# Patient Record
Sex: Male | Born: 1970 | Race: White | Hispanic: No | Marital: Married | Smoking: Never smoker
Health system: Southern US, Community
[De-identification: ages and names within clinical notes are randomized; demographics above are authoritative.]

## PROBLEM LIST (undated history)

## (undated) DIAGNOSIS — M1990 Unspecified osteoarthritis, unspecified site: Secondary | ICD-10-CM

## (undated) DIAGNOSIS — M797 Fibromyalgia: Secondary | ICD-10-CM

## (undated) HISTORY — DX: Fibromyalgia: M79.7

## (undated) HISTORY — DX: Unspecified osteoarthritis, unspecified site: M19.90

## (undated) HISTORY — PX: BACK SURGERY: SHX140

## (undated) HISTORY — PX: SHOULDER SURGERY: SHX246

## (undated) HISTORY — PX: KNEE SURGERY: SHX244

## (undated) HISTORY — PX: FOOT SURGERY: SHX648

## (undated) HISTORY — PX: VASECTOMY: SHX75

## (undated) HISTORY — PX: COLONOSCOPY: SHX174

## (undated) HISTORY — PX: ELBOW SURGERY: SHX618

## (undated) HISTORY — PX: HAND SURGERY: SHX662

---

## 2007-05-29 ENCOUNTER — Emergency Department: Payer: Self-pay | Admitting: Emergency Medicine

## 2007-08-11 ENCOUNTER — Ambulatory Visit: Payer: Self-pay | Admitting: Internal Medicine

## 2007-08-30 ENCOUNTER — Emergency Department: Payer: Self-pay | Admitting: Emergency Medicine

## 2013-12-16 ENCOUNTER — Ambulatory Visit (INDEPENDENT_AMBULATORY_CARE_PROVIDER_SITE_OTHER): Payer: Federal, State, Local not specified - PPO | Admitting: Podiatry

## 2013-12-16 ENCOUNTER — Encounter: Payer: Self-pay | Admitting: Podiatry

## 2013-12-16 ENCOUNTER — Ambulatory Visit: Payer: Self-pay | Admitting: Podiatry

## 2013-12-16 ENCOUNTER — Ambulatory Visit (INDEPENDENT_AMBULATORY_CARE_PROVIDER_SITE_OTHER): Payer: Federal, State, Local not specified - PPO

## 2013-12-16 VITALS — BP 125/82 | HR 79 | Resp 16 | Ht 70.0 in | Wt 340.0 lb

## 2013-12-16 DIAGNOSIS — M722 Plantar fascial fibromatosis: Secondary | ICD-10-CM

## 2013-12-16 DIAGNOSIS — M79671 Pain in right foot: Secondary | ICD-10-CM

## 2013-12-16 DIAGNOSIS — D2120 Benign neoplasm of connective and other soft tissue of unspecified lower limb, including hip: Secondary | ICD-10-CM

## 2013-12-16 DIAGNOSIS — M79609 Pain in unspecified limb: Secondary | ICD-10-CM

## 2013-12-16 MED ORDER — MELOXICAM 15 MG PO TABS: 15.0000 mg | ORAL_TABLET | Freq: Every day | ORAL | Status: DC

## 2013-12-16 MED ORDER — METHYLPREDNISOLONE (PAK) 4 MG PO TABS: ORAL_TABLET | ORAL | Status: DC

## 2013-12-16 NOTE — Progress Notes (Signed)
He presents today after a fall stating that his right foot is starting to feel better after the fall but he did give a quite a twist as he was trying to get into his hot tub after and ice event. He states that his heels feel like his walking glass first thing in the morning anytime he is been sitting for a while and gets back up to walk. He states this been going on for quite some time and he still has pain to the dorsum of his right foot where surgery had been previously performed.  Objective: Vital signs are stable alert and oriented x3. Pulses are palpable. Pain on palpation medial continued tubercles bilateral. Radiographic evaluation of the right foot does not demonstrate any type of osseous abnormalities consistent with an injury. He does have soft tissue increase in density at the plantar fascial calcaneal insertion site with a small plantar distally oriented calcaneal heel spur. This is indicative of plantar fasciitis. Cutaneous evaluation demonstrates supple while hydrated cutis a few areas of scratches and scraped from the fall.  Assessment: Plantar fasciitis bilateral foot. Dorsal swelling right foot. Sprain right foot.  Plan: Discussed etiology pathology conservative versus surgical therapies. I injected the bilateral heels today wrote her prescription for Sterapred Dosepak to be followed by Mobic and I will followup with him in one month we discussed appropriate shoe gear stretching exercises and ice therapy. Both oral and written home-going instructions were given.

## 2014-01-13 ENCOUNTER — Encounter: Payer: Self-pay | Admitting: Podiatry

## 2014-01-13 ENCOUNTER — Ambulatory Visit (INDEPENDENT_AMBULATORY_CARE_PROVIDER_SITE_OTHER): Payer: Federal, State, Local not specified - PPO | Admitting: Podiatry

## 2014-01-13 VITALS — BP 144/88 | HR 71 | Resp 16

## 2014-01-13 DIAGNOSIS — M722 Plantar fascial fibromatosis: Secondary | ICD-10-CM

## 2014-01-13 NOTE — Progress Notes (Signed)
Doing all right , maybe 80% better .  Objective: Vital signs are stable he is alert and oriented x3. His pain on palpation medial continued tubercles. Pulses are strongly palpable no pain on medial lateral compression the calcaneus.  Assessment: Plantar fasciitis bilateral.  Plan: Injected bilateral heels today continue anti-inflammatories followup with me in the near future.

## 2014-02-10 ENCOUNTER — Ambulatory Visit (INDEPENDENT_AMBULATORY_CARE_PROVIDER_SITE_OTHER): Payer: Federal, State, Local not specified - PPO | Admitting: Podiatry

## 2014-02-10 VITALS — BP 118/75 | HR 95 | Resp 16 | Ht 69.0 in | Wt 384.0 lb

## 2014-02-10 DIAGNOSIS — M778 Other enthesopathies, not elsewhere classified: Secondary | ICD-10-CM

## 2014-02-10 DIAGNOSIS — M7750 Other enthesopathy of unspecified foot: Secondary | ICD-10-CM

## 2014-02-10 DIAGNOSIS — M779 Enthesopathy, unspecified: Principal | ICD-10-CM

## 2014-02-10 NOTE — Progress Notes (Signed)
He follows up today for bilateral plantar fasciitis that he also complains of a painful arthritic area to the dorsal aspect of the right foot that previously had surgery. He states this been bothering him and swelling for the past few days.  Objective: Vital signs are stable he is alert and oriented x3. Pulses are strongly palpable bilateral and there is no pain on palpation medial continued tubercles of the bilateral foot. He does have pain on palpation with an area of swelling dorsal aspect of the right foot overlying a surgical scar.  Assessment: Bursitis osteoarthritis right foot resolving plantar fasciitis bilateral.  Plan: Injected dorsal aspect of the right foot today with Kenalog and local anesthetic followup with him as needed.

## 2014-03-03 ENCOUNTER — Ambulatory Visit: Payer: Self-pay | Admitting: Physician Assistant

## 2014-03-24 ENCOUNTER — Ambulatory Visit: Payer: Federal, State, Local not specified - PPO | Admitting: Podiatry

## 2014-04-07 ENCOUNTER — Ambulatory Visit (INDEPENDENT_AMBULATORY_CARE_PROVIDER_SITE_OTHER): Payer: Federal, State, Local not specified - PPO | Admitting: Podiatry

## 2014-04-07 VITALS — BP 120/82 | HR 74 | Resp 16

## 2014-04-07 DIAGNOSIS — L03119 Cellulitis of unspecified part of limb: Secondary | ICD-10-CM

## 2014-04-07 DIAGNOSIS — L03115 Cellulitis of right lower limb: Secondary | ICD-10-CM

## 2014-04-07 DIAGNOSIS — L02419 Cutaneous abscess of limb, unspecified: Secondary | ICD-10-CM

## 2014-04-07 MED ORDER — CEPHALEXIN 500 MG PO CAPS: 500.0000 mg | ORAL_CAPSULE | Freq: Four times a day (QID) | ORAL | Status: DC

## 2014-04-07 NOTE — Progress Notes (Signed)
He presents today complaining of a red hot swollen cellulitic right leg is also complaining of right shoulder pain due to a recent overexertion. States that he has been running slight fevers over the weekend. He denies nausea and vomiting and calf pain.  Objective: Vital signs are stable he is alert and oriented x3 pulses are intact. Mildly edematous right calf with cellulitis excoriations to the posterior aspect of the right calf are also noted. No calf pain.  Assessment: Cellulitis right calf and leg. History of plantar fasciitis. History of osteoarthritis dorsal aspect right foot.  Plan: Keflex 500 mg 1 by mouth 4 times a day x10 days. We'll followup with him in 2 weeks

## 2014-04-18 ENCOUNTER — Other Ambulatory Visit: Payer: Self-pay | Admitting: *Deleted

## 2014-04-18 MED ORDER — MELOXICAM 15 MG PO TABS: 15.0000 mg | ORAL_TABLET | Freq: Every day | ORAL | Status: DC

## 2014-04-18 NOTE — Telephone Encounter (Signed)
[facility] sent fax request for mobic 15 mg #30 with 3 refills. Per dr Milinda Pointer fill rx.

## 2014-04-23 ENCOUNTER — Other Ambulatory Visit: Payer: Self-pay | Admitting: *Deleted

## 2014-04-23 NOTE — Telephone Encounter (Signed)
Refill request for Mobic 15mg , quantity of 30.  Dr. Marta Antu the refill.

## 2014-04-24 ENCOUNTER — Ambulatory Visit: Payer: Federal, State, Local not specified - PPO | Admitting: Podiatrist

## 2014-05-09 ENCOUNTER — Ambulatory Visit: Payer: Self-pay | Admitting: Orthopedic Surgery

## 2014-05-14 ENCOUNTER — Other Ambulatory Visit: Payer: Self-pay | Admitting: Orthopedic Surgery

## 2014-05-14 DIAGNOSIS — M25511 Pain in right shoulder: Secondary | ICD-10-CM

## 2014-05-26 ENCOUNTER — Other Ambulatory Visit: Payer: Self-pay

## 2014-08-13 ENCOUNTER — Ambulatory Visit (INDEPENDENT_AMBULATORY_CARE_PROVIDER_SITE_OTHER): Payer: Federal, State, Local not specified - PPO | Admitting: Podiatry

## 2014-08-13 ENCOUNTER — Encounter: Payer: Self-pay | Admitting: Podiatry

## 2014-08-13 VITALS — BP 139/91 | HR 83 | Resp 16

## 2014-08-13 DIAGNOSIS — I872 Venous insufficiency (chronic) (peripheral): Secondary | ICD-10-CM

## 2014-08-13 DIAGNOSIS — R609 Edema, unspecified: Secondary | ICD-10-CM

## 2014-08-13 NOTE — Progress Notes (Signed)
He presents today stating that his feet are doing pretty good he is having issues with his legs. He states that his legs swell a regular basis and routinely go down with elevation and once he wakes up in the morning however he states recently he has noticed a change in his legs never seemed to go down even with elevation and after bed rest. He denies fever chills nausea vomiting muscle aches pains shortness of breath. There he does relate sleeping in a recliner.   objective: Vital signs are was alert oriented x3. Diastolic slightly elevated today from previous visits. Pulses are palpable. Hard indurated swollen and edematous legs bilateral. No calf pain. No warmth on palpation.  Assessment: Edema possible venous insufficiency bilateral. Suggested that he followup with his primary Dr. for fluid pill and evaluation of his heart such as an EKG and echocardiogram.  Plan: He will followup with her in vascular for a Doppler duplex and visit. He will followup with his primary Dr.

## 2014-09-18 ENCOUNTER — Ambulatory Visit (INDEPENDENT_AMBULATORY_CARE_PROVIDER_SITE_OTHER): Payer: Federal, State, Local not specified - PPO | Admitting: Podiatry

## 2014-09-18 ENCOUNTER — Encounter: Payer: Self-pay | Admitting: Podiatry

## 2014-09-18 ENCOUNTER — Ambulatory Visit (INDEPENDENT_AMBULATORY_CARE_PROVIDER_SITE_OTHER): Payer: Federal, State, Local not specified - PPO

## 2014-09-18 VITALS — BP 124/77 | HR 109 | Resp 16

## 2014-09-18 DIAGNOSIS — M722 Plantar fascial fibromatosis: Secondary | ICD-10-CM

## 2014-09-18 MED ORDER — TRIAMCINOLONE ACETONIDE 10 MG/ML IJ SUSP
10.0000 mg | Freq: Once | INTRAMUSCULAR | Status: AC
  Administered 2014-09-18: 10 mg

## 2014-09-18 NOTE — Patient Instructions (Signed)
Plantar Fasciitis (Heel Spur Syndrome) with Rehab The plantar fascia is a fibrous, ligament-like, soft-tissue structure that spans the bottom of the foot. Plantar fasciitis is a condition that causes pain in the foot due to inflammation of the tissue. SYMPTOMS   Pain and tenderness on the underneath side of the foot.  Pain that worsens with standing or walking. CAUSES  Plantar fasciitis is caused by irritation and injury to the plantar fascia on the underneath side of the foot. Common mechanisms of injury include:  Direct trauma to bottom of the foot.  Damage to a small nerve that runs under the foot where the main fascia attaches to the heel bone.  Stress placed on the plantar fascia due to bone spurs. RISK INCREASES WITH:   Activities that place stress on the plantar fascia (running, jumping, pivoting, or cutting).  Poor strength and flexibility.  Improperly fitted shoes.  Tight calf muscles.  Flat feet.  Failure to warm-up properly before activity.  Obesity. PREVENTION  Warm up and stretch properly before activity.  Allow for adequate recovery between workouts.  Maintain physical fitness:  Strength, flexibility, and endurance.  Cardiovascular fitness.  Maintain a health body weight.  Avoid stress on the plantar fascia.  Wear properly fitted shoes, including arch supports for individuals who have flat feet. PROGNOSIS  If treated properly, then the symptoms of plantar fasciitis usually resolve without surgery. However, occasionally surgery is necessary. RELATED COMPLICATIONS   Recurrent symptoms that may result in a chronic condition.  Problems of the lower back that are caused by compensating for the injury, such as limping.  Pain or weakness of the foot during push-off following surgery.  Chronic inflammation, scarring, and partial or complete fascia tear, occurring more often from repeated injections. TREATMENT  Treatment initially involves the use of  ice and medication to help reduce pain and inflammation. The use of strengthening and stretching exercises may help reduce pain with activity, especially stretches of the Achilles tendon. These exercises may be performed at home or with a therapist. Your caregiver may recommend that you use heel cups of arch supports to help reduce stress on the plantar fascia. Occasionally, corticosteroid injections are given to reduce inflammation. If symptoms persist for greater than 6 months despite non-surgical (conservative), then surgery may be recommended.  MEDICATION   If pain medication is necessary, then nonsteroidal anti-inflammatory medications, such as aspirin and ibuprofen, or other minor pain relievers, such as acetaminophen, are often recommended.  Do not take pain medication within 7 days before surgery.  Prescription pain relievers may be given if deemed necessary by your caregiver. Use only as directed and only as much as you need.  Corticosteroid injections may be given by your caregiver. These injections should be reserved for the most serious cases, because they may only be given a certain number of times. HEAT AND COLD  Cold treatment (icing) relieves pain and reduces inflammation. Cold treatment should be applied for 10 to 15 minutes every 2 to 3 hours for inflammation and pain and immediately after any activity that aggravates your symptoms. Use ice packs or massage the area with a piece of ice (ice massage).  Heat treatment may be used prior to performing the stretching and strengthening activities prescribed by your caregiver, physical therapist, or athletic trainer. Use a heat pack or soak the injury in warm water. SEEK IMMEDIATE MEDICAL CARE IF:  Treatment seems to offer no benefit, or the condition worsens.  Any medications produce adverse side effects. EXERCISES RANGE   OF MOTION (ROM) AND STRETCHING EXERCISES - Plantar Fasciitis (Heel Spur Syndrome) These exercises may help you  when beginning to rehabilitate your injury. Your symptoms may resolve with or without further involvement from your physician, physical therapist or athletic trainer. While completing these exercises, remember:   Restoring tissue flexibility helps normal motion to return to the joints. This allows healthier, less painful movement and activity.  An effective stretch should be held for at least 30 seconds.  A stretch should never be painful. You should only feel a gentle lengthening or release in the stretched tissue. RANGE OF MOTION - Toe Extension, Flexion  Sit with your right / left leg crossed over your opposite knee.  Grasp your toes and gently pull them back toward the top of your foot. You should feel a stretch on the bottom of your toes and/or foot.  Hold this stretch for __________ seconds.  Now, gently pull your toes toward the bottom of your foot. You should feel a stretch on the top of your toes and or foot.  Hold this stretch for __________ seconds. Repeat __________ times. Complete this stretch __________ times per day.  RANGE OF MOTION - Ankle Dorsiflexion, Active Assisted  Remove shoes and sit on a chair that is preferably not on a carpeted surface.  Place right / left foot under knee. Extend your opposite leg for support.  Keeping your heel down, slide your right / left foot back toward the chair until you feel a stretch at your ankle or calf. If you do not feel a stretch, slide your bottom forward to the edge of the chair, while still keeping your heel down.  Hold this stretch for __________ seconds. Repeat __________ times. Complete this stretch __________ times per day.  STRETCH - Gastroc, Standing  Place hands on wall.  Extend right / left leg, keeping the front knee somewhat bent.  Slightly point your toes inward on your back foot.  Keeping your right / left heel on the floor and your knee straight, shift your weight toward the wall, not allowing your back to  arch.  You should feel a gentle stretch in the right / left calf. Hold this position for __________ seconds. Repeat __________ times. Complete this stretch __________ times per day. STRETCH - Soleus, Standing  Place hands on wall.  Extend right / left leg, keeping the other knee somewhat bent.  Slightly point your toes inward on your back foot.  Keep your right / left heel on the floor, bend your back knee, and slightly shift your weight over the back leg so that you feel a gentle stretch deep in your back calf.  Hold this position for __________ seconds. Repeat __________ times. Complete this stretch __________ times per day. STRETCH - Gastrocsoleus, Standing  Note: This exercise can place a lot of stress on your foot and ankle. Please complete this exercise only if specifically instructed by your caregiver.   Place the ball of your right / left foot on a step, keeping your other foot firmly on the same step.  Hold on to the wall or a rail for balance.  Slowly lift your other foot, allowing your body weight to press your heel down over the edge of the step.  You should feel a stretch in your right / left calf.  Hold this position for __________ seconds.  Repeat this exercise with a slight bend in your right / left knee. Repeat __________ times. Complete this stretch __________ times per day.    STRENGTHENING EXERCISES - Plantar Fasciitis (Heel Spur Syndrome)  These exercises may help you when beginning to rehabilitate your injury. They may resolve your symptoms with or without further involvement from your physician, physical therapist or athletic trainer. While completing these exercises, remember:   Muscles can gain both the endurance and the strength needed for everyday activities through controlled exercises.  Complete these exercises as instructed by your physician, physical therapist or athletic trainer. Progress the resistance and repetitions only as guided. STRENGTH -  Towel Curls  Sit in a chair positioned on a non-carpeted surface.  Place your foot on a towel, keeping your heel on the floor.  Pull the towel toward your heel by only curling your toes. Keep your heel on the floor.  If instructed by your physician, physical therapist or athletic trainer, add ____________________ at the end of the towel. Repeat __________ times. Complete this exercise __________ times per day. STRENGTH - Ankle Inversion  Secure one end of a rubber exercise band/tubing to a fixed object (table, pole). Loop the other end around your foot just before your toes.  Place your fists between your knees. This will focus your strengthening at your ankle.  Slowly, pull your big toe up and in, making sure the band/tubing is positioned to resist the entire motion.  Hold this position for __________ seconds.  Have your muscles resist the band/tubing as it slowly pulls your foot back to the starting position. Repeat __________ times. Complete this exercises __________ times per day.  Document Released: 11/14/2005 Document Revised: 02/06/2012 Document Reviewed: 02/26/2009 [employer] Patient Information 2015 [employer]. This information is not intended to replace advice given to you by your health care provider. Make sure you discuss any questions you have with your health care provider.  

## 2014-09-18 NOTE — Progress Notes (Signed)
Patient ID: Jacob Alvarez, male   DOB: [birth year], 43 y.o.   MRN: [id number]  Subjective: 43 year old male returns the office today for complaints of bilateral heel pain. He states he previously had an injection earlier this year by Dr. Milinda Pointer to both heels. He states that he started to have some reoccurrence of symptoms into the same area as before. He states that he has pain mostly in the morning or after periods of rest. He states that the pain is better with ambulation. He can be going to [employer] next week and is requesting injections to the bilateral feet before he goes. He's been continuing stretching exercises as well as icing. He denies any recent erythema or cellulitis to his bilateral lower extremities. He is also followup with been studies for which he has seen a vein specialist next week. He states the appearance of his legs are the same as they have been over the last couple of years. He denies any recent fevers, chills, nausea, vomiting. Denies any recent injury or trauma to the area. No other complaints at this time.  Objective: AAO x3, NAD DP/PT pulses palpable bilaterally, CRT less than 3 seconds Protective sensation intact with Simms Weinstein monofilament, vibratory sensation intact, Achilles tendon reflex intact Bilateral lower extremity edema and varicose veins. There is no erythema, cellulitis at this time. Tenderness to palpation of the plantar medial tubercle of the calcaneus at the insertion the plantar fascia. There is no pain with lateral compression or along the posterior aspect of the calcaneus. No pain along the course of plantar fascial in the arch of the foot. Plantar fascia appears intact. Decrease in medial arch height upon weightbearing. There is no tenderness to palpation over other areas of the foot or pain with vibratory sensation. MMT 5/5, ROM WNL No calf pain with compression, erythema, increased warmth.  Assessment: 43 year old male with bilateral plantar  fasciitis  Plan: -X-rays were obtained and the left lower extremity as they were previously taken on the right. -Conservative versus surgical treatment discussed including alternatives, risks, complications. -Patient elects to proceed with steroid injection into the bilateral heels. He understands the risks and complications associated with injection. Under sterile skin preparation, a total of 2.5cc of kenalog 10, 0.5% Marcaine plain, and 2% lidocaine plain were infiltrated into the symptomatic area without complication. A band-aid was applied. Patient tolerated the injection well without complication.  -Continue ice to the affected area. -Continue stretching exercises. -Stressed the importance of supportive shoe gear -Call up with a vein specialist. -Followup in 4 weeks or sooner if any problems are to arise or any changes symptoms. In the meantime call the office with any questions, concerns.

## 2014-10-07 ENCOUNTER — Other Ambulatory Visit: Payer: Self-pay | Admitting: *Deleted

## 2014-10-07 MED ORDER — MELOXICAM 15 MG PO TABS: 15.0000 mg | ORAL_TABLET | Freq: Every day | ORAL | Status: AC

## 2014-10-07 NOTE — Telephone Encounter (Signed)
[facility] sent refill req for mobic 15 mg #30 with 3 refills one by mouth daily. Per dr Milinda Pointer refill #30 with 3 refills.

## 2014-10-15 ENCOUNTER — Ambulatory Visit (INDEPENDENT_AMBULATORY_CARE_PROVIDER_SITE_OTHER): Payer: Federal, State, Local not specified - PPO | Admitting: Podiatry

## 2014-10-15 VITALS — BP 125/79 | HR 78 | Resp 16

## 2014-10-15 DIAGNOSIS — M722 Plantar fascial fibromatosis: Secondary | ICD-10-CM

## 2014-10-15 DIAGNOSIS — L03115 Cellulitis of right lower limb: Secondary | ICD-10-CM

## 2014-10-15 NOTE — Progress Notes (Signed)
Presents today for follow-up of plantar fasciitis bilateral. He states that he is doing pretty well.  Objective: Vital signs are stable he is alert and oriented 3 no pain on palpation medial calcaneal tubercles bilateral.  Assessment: Well-healing plantar fasciitis bilateral.  Plan: Follow up with me as needed.

## 2015-05-28 ENCOUNTER — Other Ambulatory Visit: Payer: Self-pay | Admitting: Bariatrics

## 2015-05-28 DIAGNOSIS — E669 Obesity, unspecified: Secondary | ICD-10-CM

## 2015-06-08 ENCOUNTER — Other Ambulatory Visit: Payer: Self-pay

## 2015-06-08 ENCOUNTER — Ambulatory Visit: Payer: Federal, State, Local not specified - PPO

## 2015-06-08 ENCOUNTER — Ambulatory Visit: Payer: Self-pay

## 2015-06-18 ENCOUNTER — Ambulatory Visit: Payer: Federal, State, Local not specified - PPO | Attending: Otolaryngology

## 2015-06-18 DIAGNOSIS — G4733 Obstructive sleep apnea (adult) (pediatric): Secondary | ICD-10-CM | POA: Insufficient documentation

## 2015-06-18 DIAGNOSIS — R0683 Snoring: Secondary | ICD-10-CM | POA: Diagnosis not present

## 2015-06-29 ENCOUNTER — Ambulatory Visit
Admission: RE | Admit: 2015-06-29 | Discharge: 2015-06-29 | Disposition: A | Discharge status: Home / Self Care | Payer: Federal, State, Local not specified - PPO | Source: Ambulatory Visit | Attending: Bariatrics | Admitting: Bariatrics

## 2015-06-29 ENCOUNTER — Ambulatory Visit: Payer: Federal, State, Local not specified - PPO

## 2015-06-29 DIAGNOSIS — E669 Obesity, unspecified: Secondary | ICD-10-CM

## 2015-08-05 ENCOUNTER — Ambulatory Visit: Payer: Federal, State, Local not specified - PPO | Attending: Otolaryngology

## 2015-08-05 DIAGNOSIS — G2581 Restless legs syndrome: Secondary | ICD-10-CM | POA: Insufficient documentation

## 2015-08-05 DIAGNOSIS — G4733 Obstructive sleep apnea (adult) (pediatric): Secondary | ICD-10-CM | POA: Diagnosis present

## 2015-08-05 DIAGNOSIS — R0683 Snoring: Secondary | ICD-10-CM | POA: Insufficient documentation

## 2015-08-06 ENCOUNTER — Ambulatory Visit: Payer: Federal, State, Local not specified - PPO

## 2015-08-12 ENCOUNTER — Encounter: Payer: Federal, State, Local not specified - PPO | Admitting: Podiatry

## 2015-08-24 ENCOUNTER — Ambulatory Visit (INDEPENDENT_AMBULATORY_CARE_PROVIDER_SITE_OTHER): Payer: Federal, State, Local not specified - PPO | Admitting: Podiatry

## 2015-08-24 ENCOUNTER — Encounter: Payer: Self-pay | Admitting: Podiatry

## 2015-08-24 VITALS — BP 120/79 | HR 74 | Resp 12

## 2015-08-24 DIAGNOSIS — M722 Plantar fascial fibromatosis: Secondary | ICD-10-CM | POA: Diagnosis not present

## 2015-08-24 NOTE — Progress Notes (Signed)
He presents today for follow-up of bilateral plantar fasciitis. He states that he was doing fine for quite some time and has recently been in the hospital for prostatitis. 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  Objective: Vital signs are stable alert and oriented 3. Pulses are strongly palpable. Neurologic sensorium is intact. Deep tendon reflexes are intact. Muscle strength normal bilateral. Cutaneous evaluation Mr. is no skin breakdown reactive hyperkeratosis medial aspect of the IP joints hallux bilateral. Pain on palpation medial calcaneal tubercles bilateral.  Assessment: Chronic intractable plantar fasciitis bilateral foot.  Plan: Discussed etiology pathology conservative versus surgical therapies. Consider a new set of Orthotics next visit. Discussed appropriate shoe gear stretching exercises ice therapy. Injected the bilateral heels today with Kenalog and local and aesthetic.

## 2015-08-28 NOTE — Progress Notes (Signed)
This encounter was created in error - please disregard.

## 2015-08-29 IMAGING — US US ABDOMEN LIMITED
1 series · 14 of 25 positions shown · non-contrast
Comparison: None.

CLINICAL DATA: 44-year-old male with obesity. Pre operative
clearance. Denies symptoms. Initial encounter.

EXAM:
US ABDOMEN LIMITED - RIGHT UPPER QUADRANT

[Series 1: us abdomen limited · 0.27mm/px · 14 of 41 slices shown]
[im 1/41]
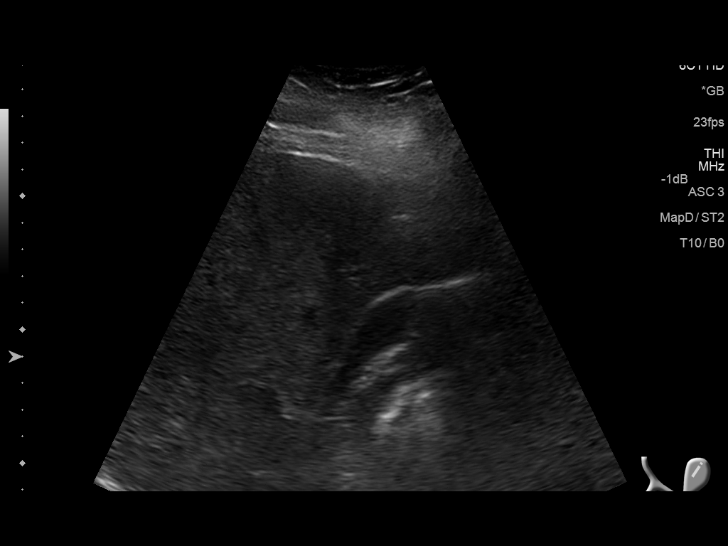
[im 4/41]
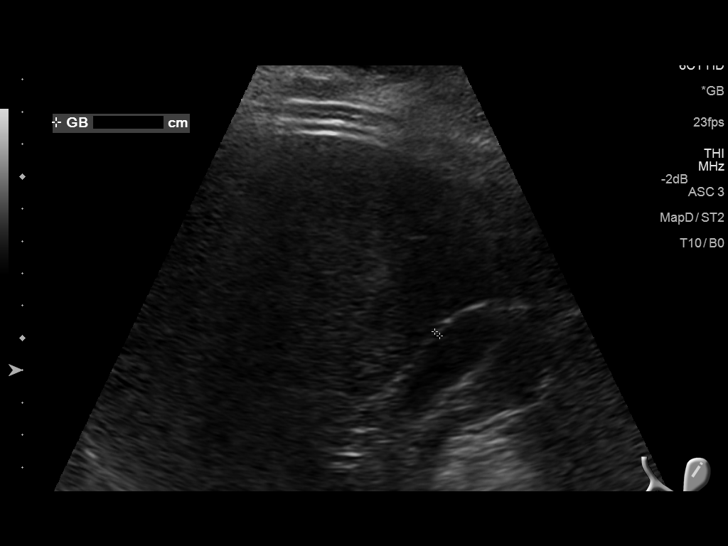
[im 7/41]
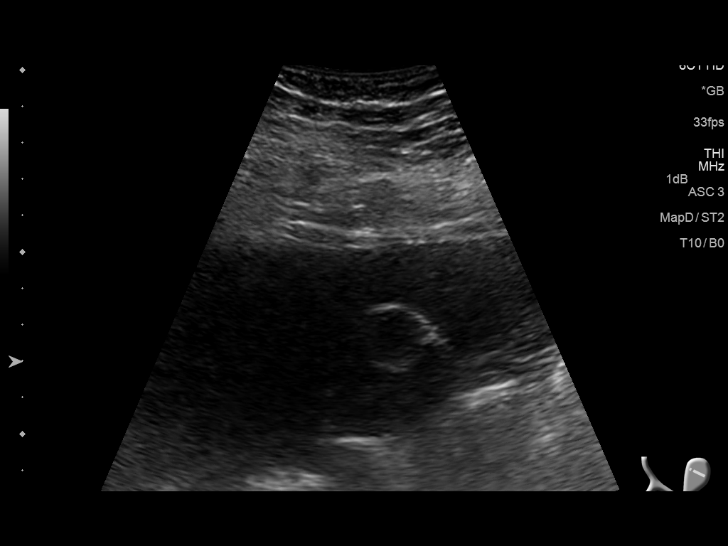
[im 11/41]
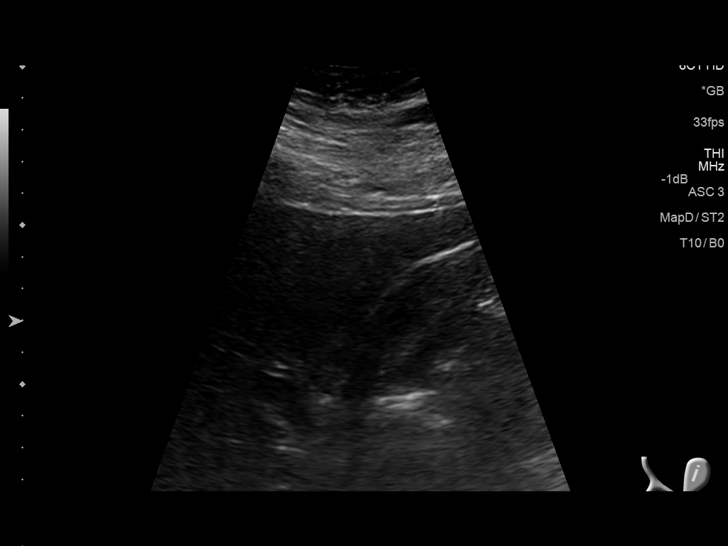
[im 14/41]
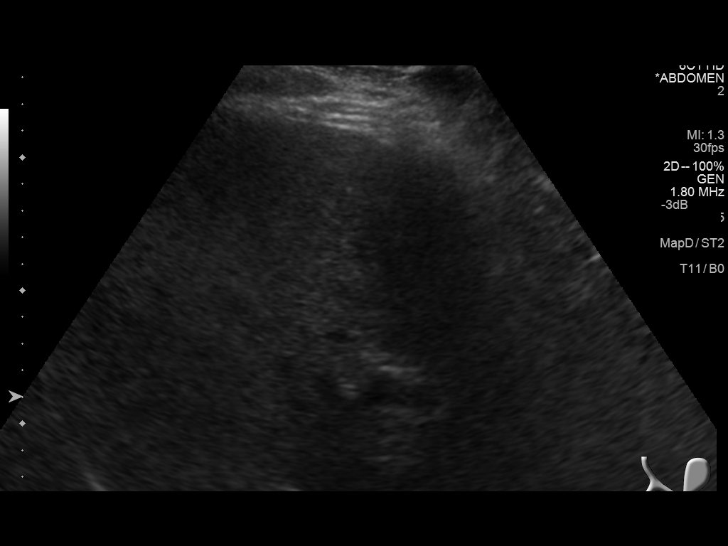
[im 16/41]
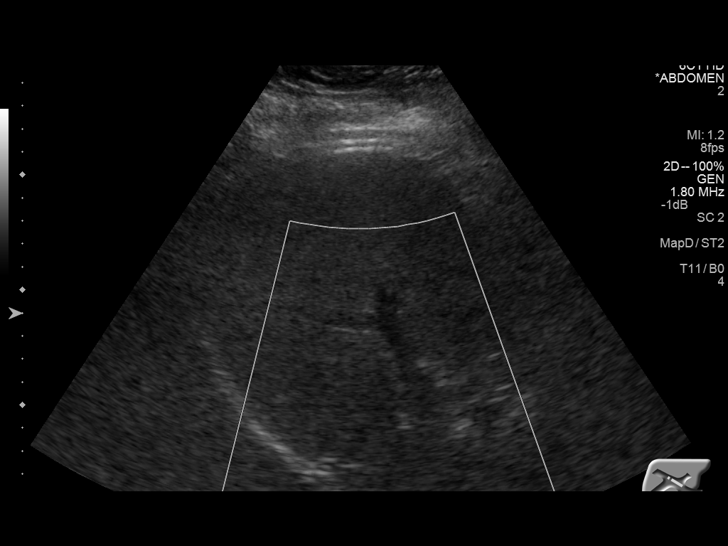
[im 19/41]
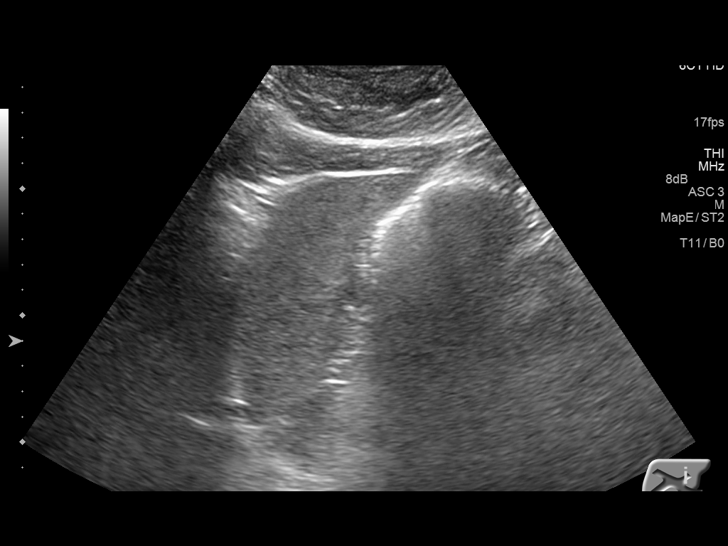
[im 22/41]
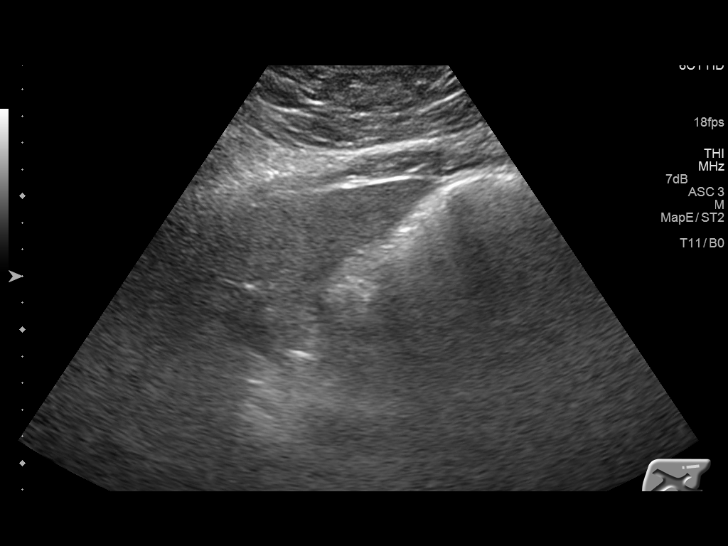
[im 26/41]
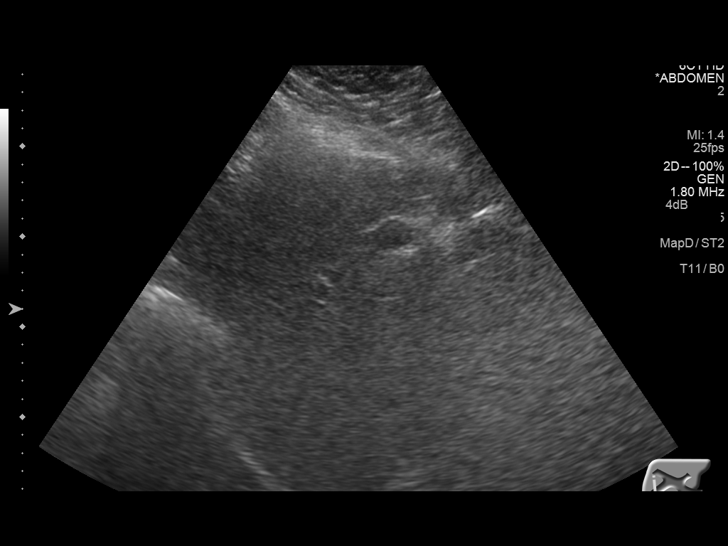
[im 27/41]
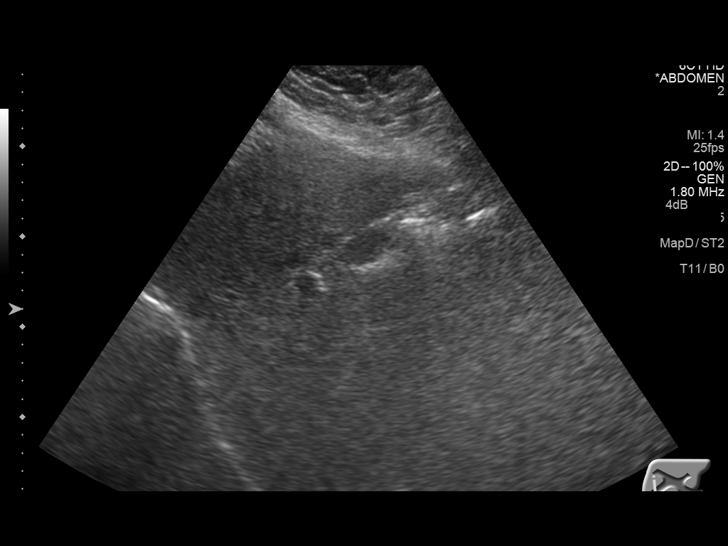
[im 31/41]
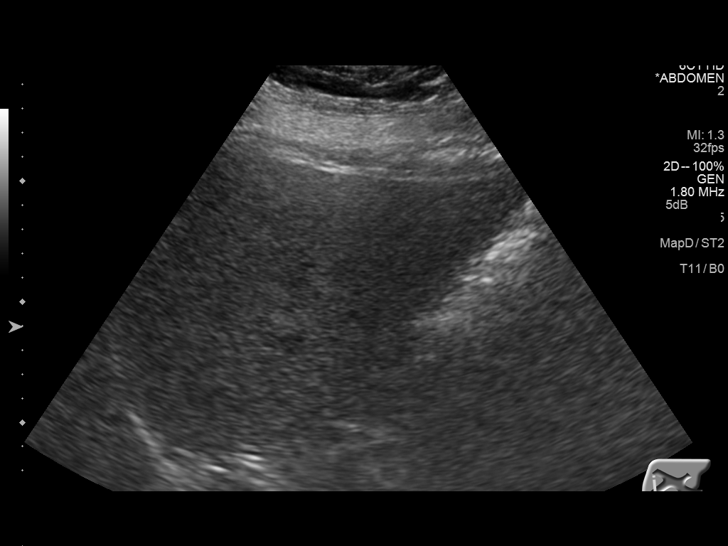
[im 34/41]
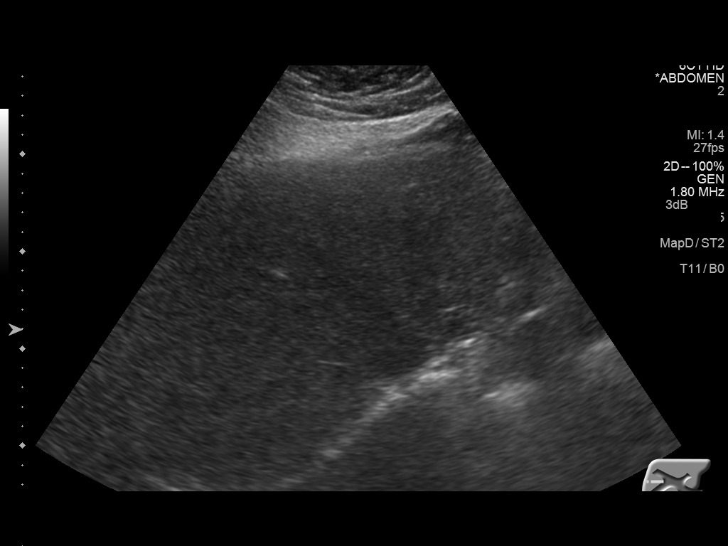
[im 37/41]
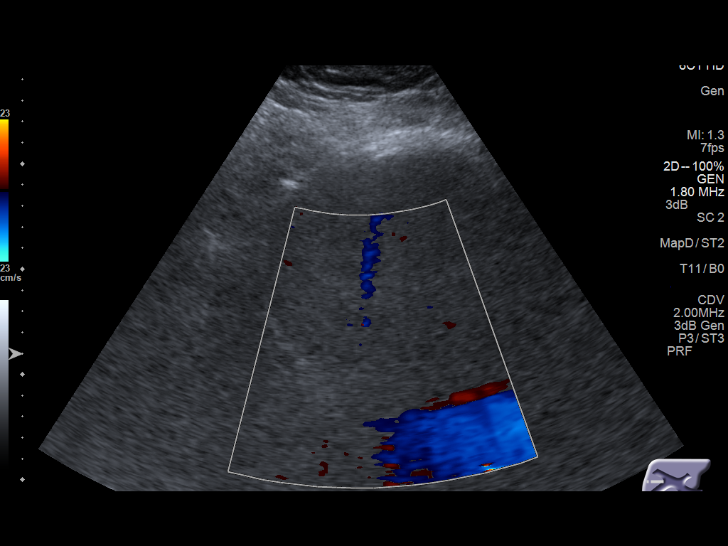
[im 41/41]
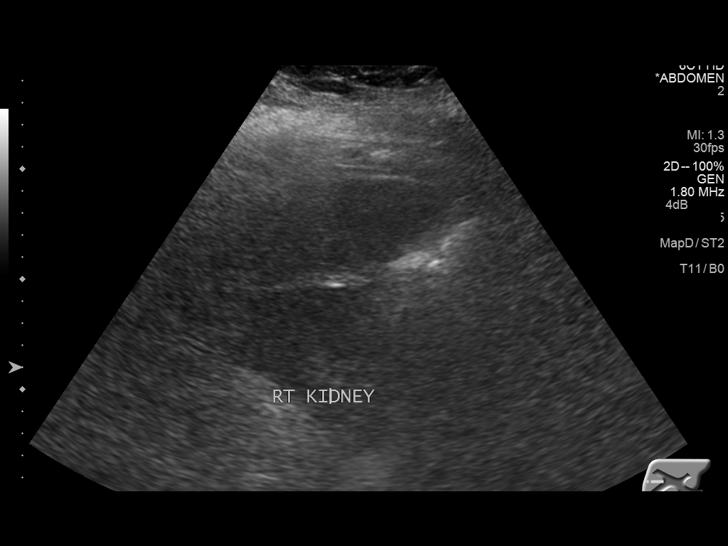

[14 of 25 positions shown; findings below may reference images not displayed]

FINDINGS: Gallbladder:

No gallstones or wall thickening visualized. No sonographic Murphy
sign noted.

Common bile duct:

Diameter: 3.7 mm.

Liver:

Increased echogenicity suggestive of fatty infiltration without
obvious mass or intrahepatic bile duct dilation.
IMPRESSION: Exam limited by patient's habitus.

Fatty infiltration liver suspected.

No gallstones noted.

## 2015-09-28 ENCOUNTER — Ambulatory Visit: Payer: Federal, State, Local not specified - PPO | Admitting: Podiatry

## 2015-11-29 IMAGING — RF DG UGI W/O KUB
1 series · 6 of 6 positions shown · non-contrast
Comparison: None.

CLINICAL DATA: Pre bariatric screening

EXAM:
UPPER GI SERIES WITHOUT KUB
TECHNIQUE: Routine upper GI series was performed with thin/high density/
barium.
FLUOROSCOPY TIME:  Fluoroscopy Time (in minutes and seconds): 0
minutes, 48 seconds
Number of Acquired Images:  7

[Series 1: fluoro_barium singleshot_bw · 0.17mm/px · 6 of 6 slices shown]
[im 1/6]
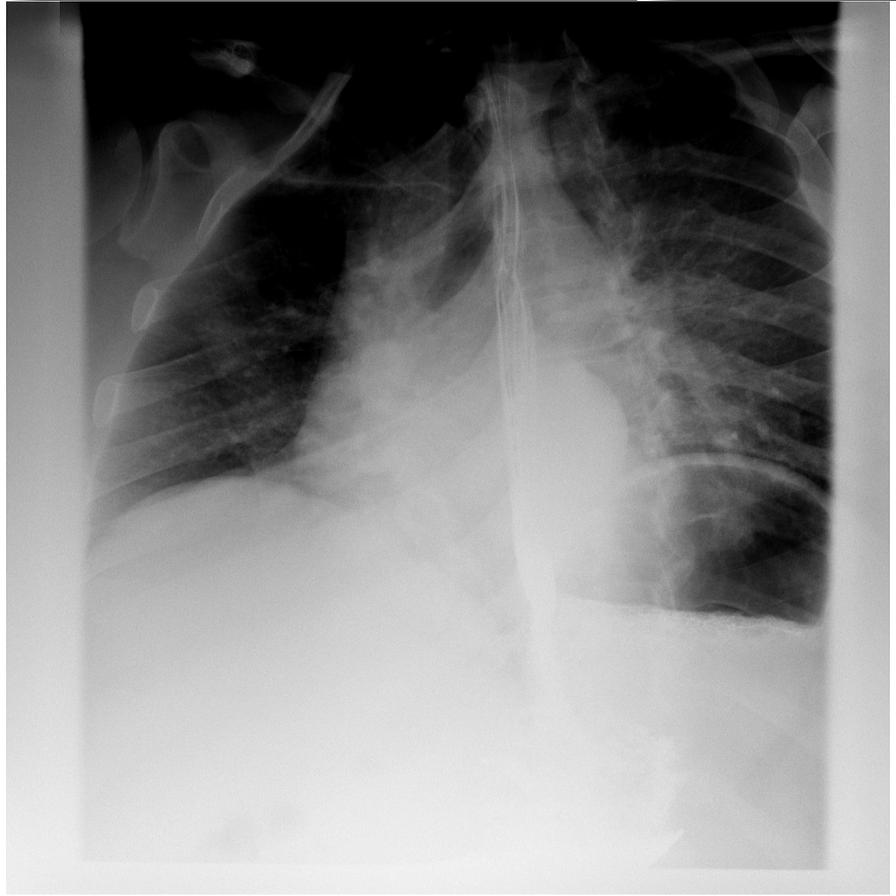
[im 2/6]
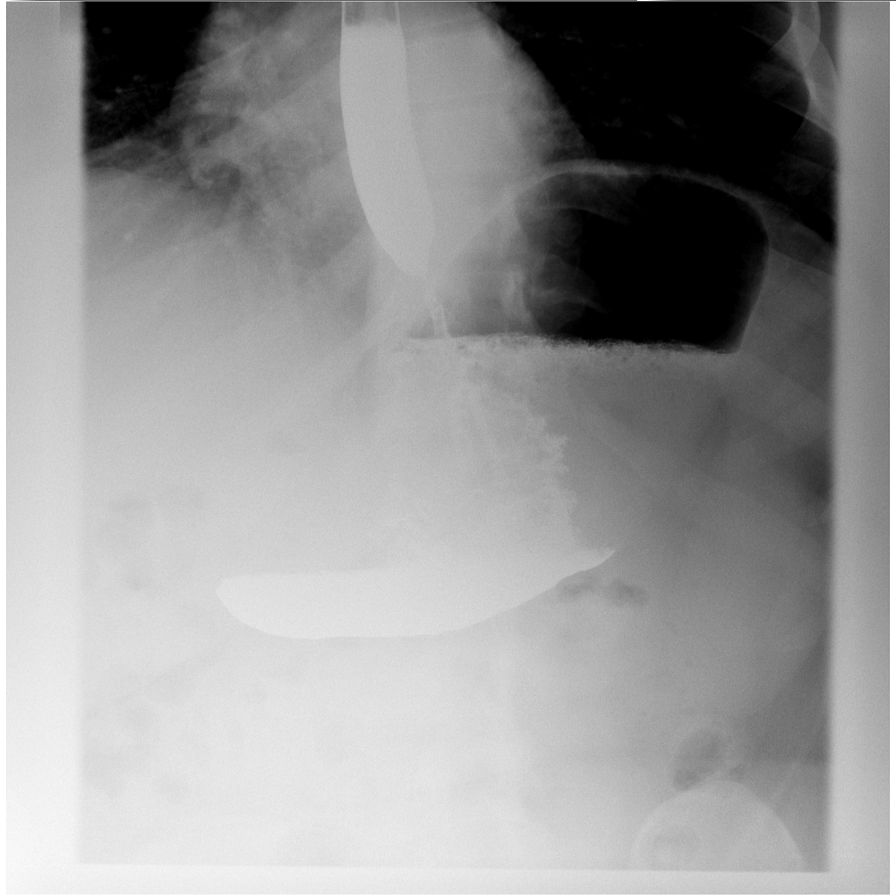
[im 3/6]
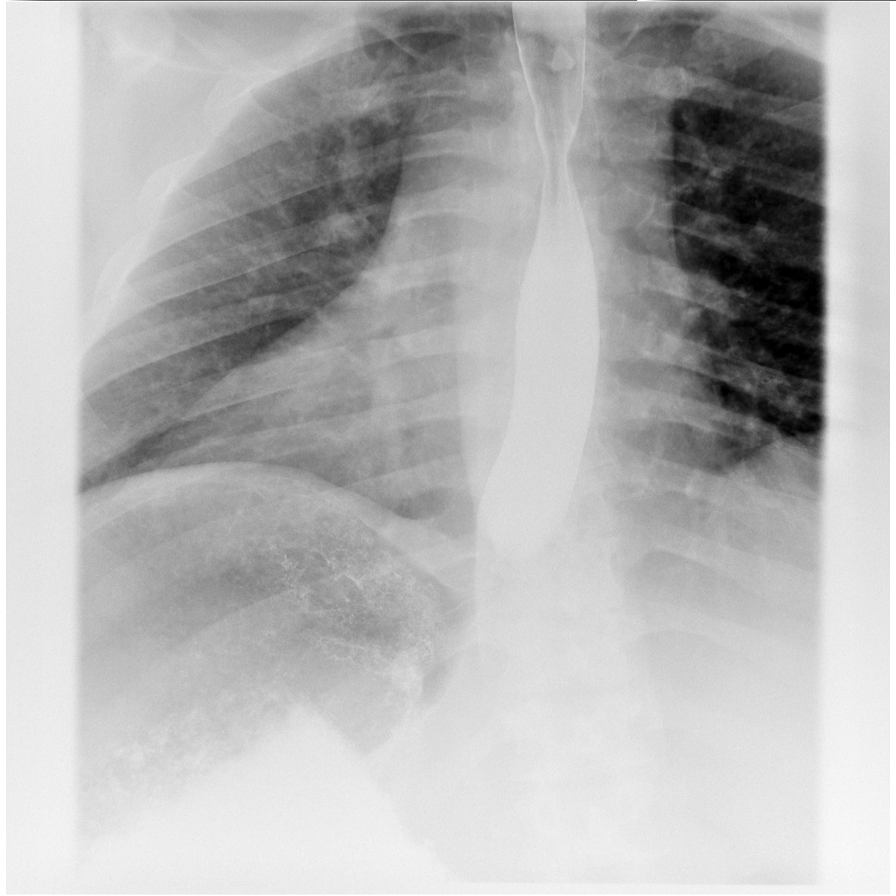
[im 4/6]
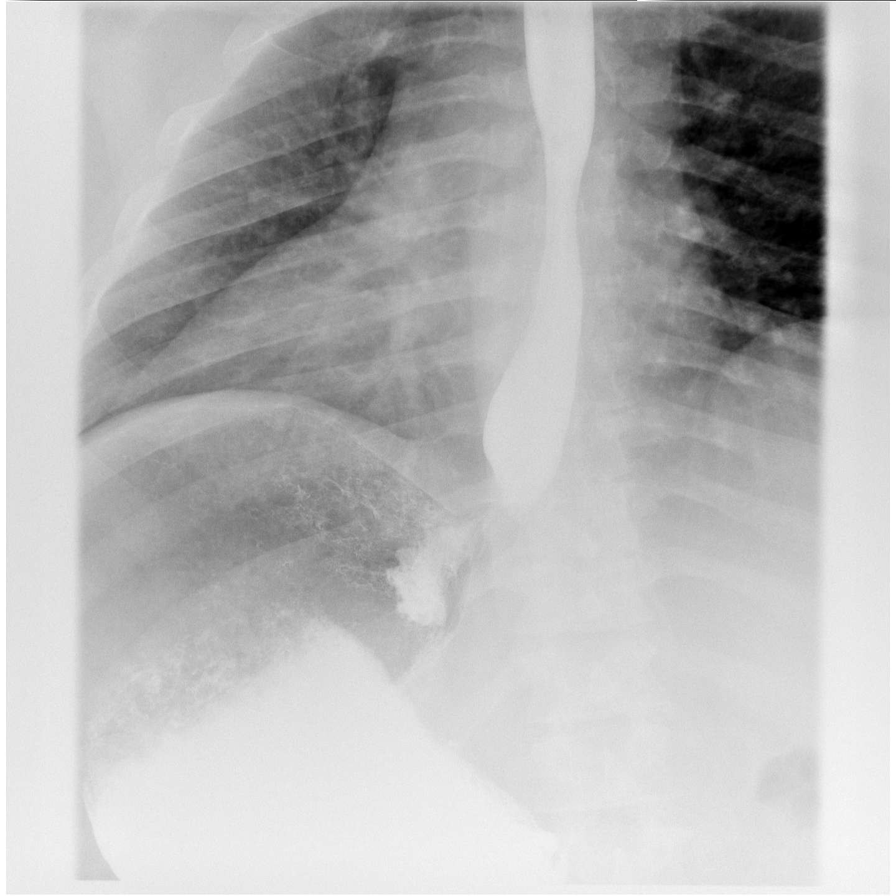
[im 5/6]
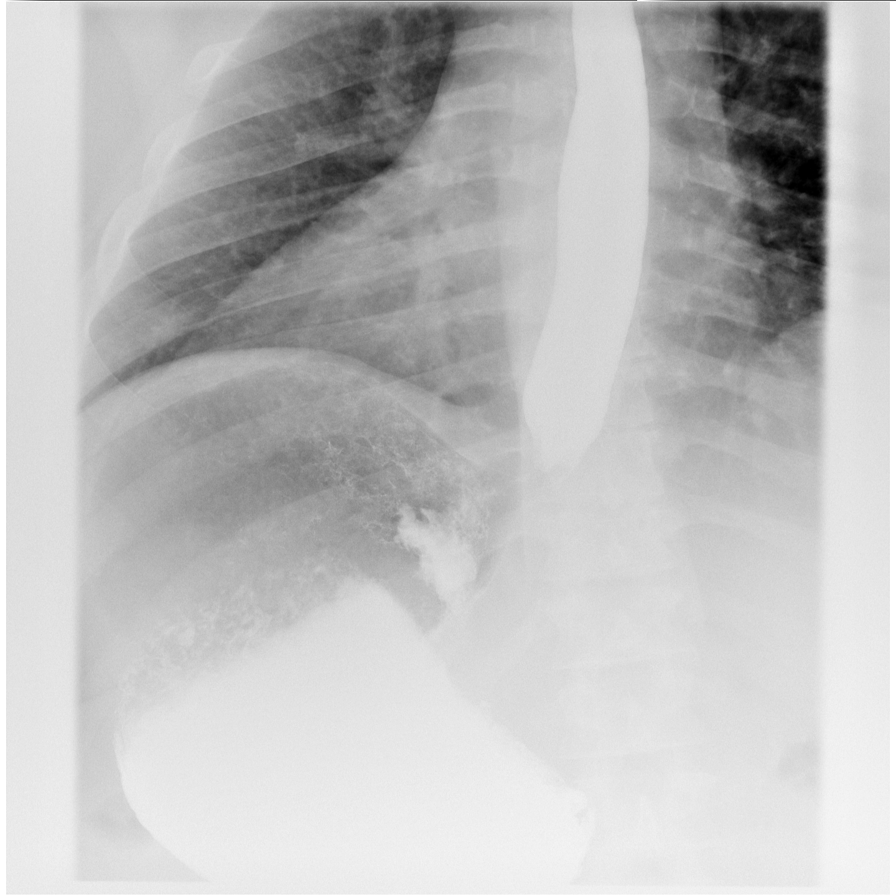
[im 6/6]
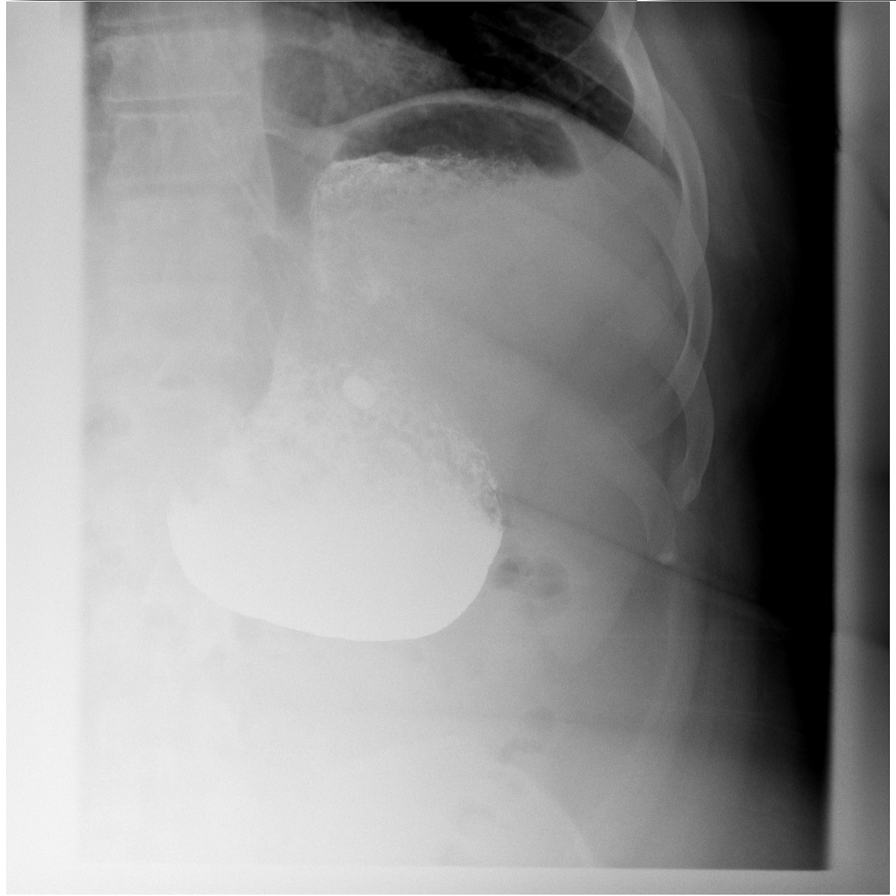

[6 of 6 positions shown; findings below may reference images not displayed]

FINDINGS: The patient ingested the thick and thin barium and the gas-forming
crystals without difficulty the thoracic esophagus distended well.
No reflux was observed. No hiatal hernia was demonstrated. The
barium tablet passed without difficulty.

There was a moderate amount of fluid and food within the stomach due
to the fact that the patient had not fasted overnight due to
abdominal discomfort in the feeling that ingesting more food would
of helped. This was not known until the study was under way. The
stomach where visualized distended well. The ligament of Treitz
however was not observed due to the large amount of fluid and food
in the stomach.
IMPRESSION: Limited study due to a large amount of retained gastric contents in
the stomach due to lack of a normal fast. No gross abnormalities
were observed.

## 2015-11-29 IMAGING — CR DG CHEST 2V
1 series · 2 of 2 positions shown · non-contrast
Comparison: None.

CLINICAL DATA: Morbid obesity.

EXAM:
CHEST  2 VIEW

[Series 1: dg chest 2 view · 0.14mm/px · 2 of 2 slices shown]
[im 1/2]
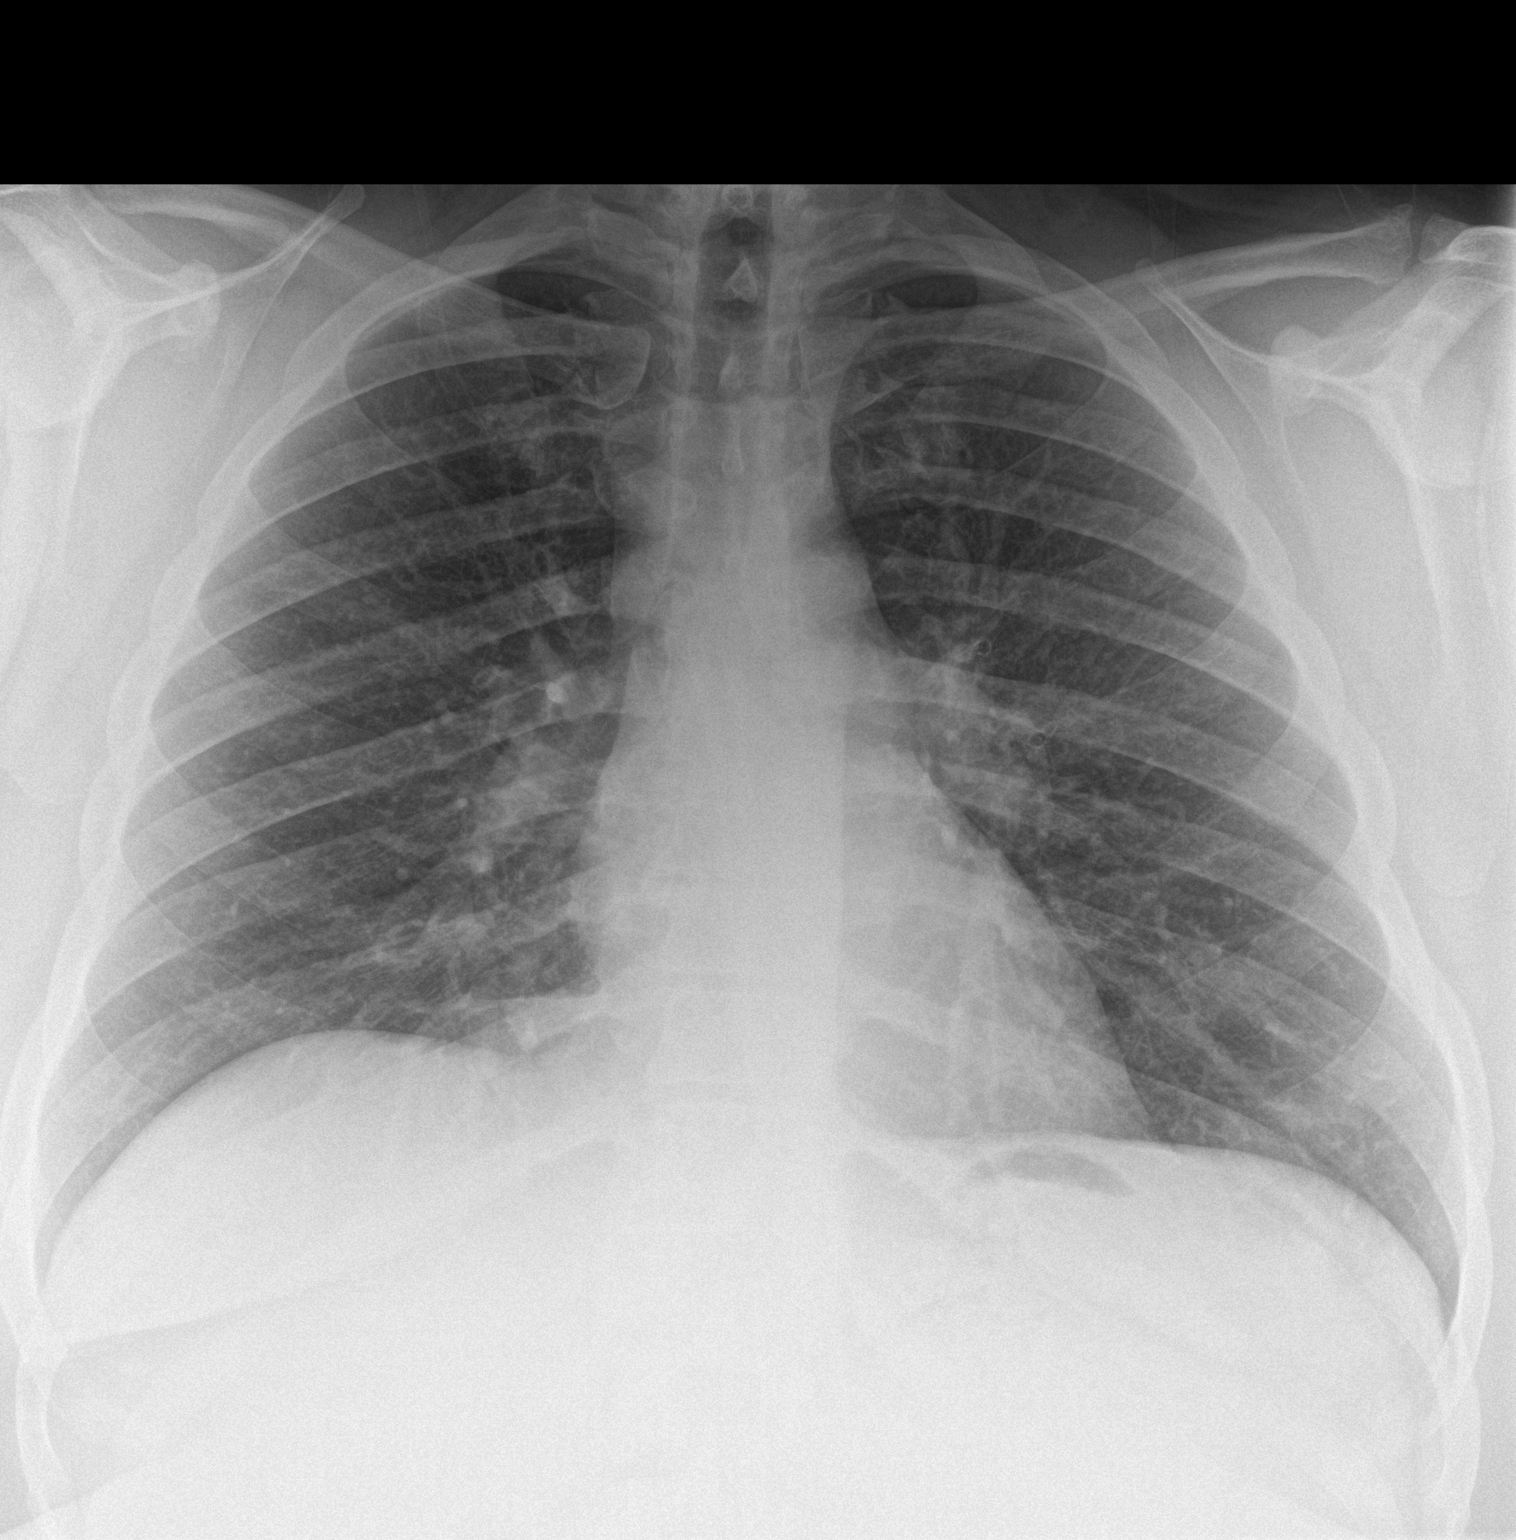
[im 2/2]
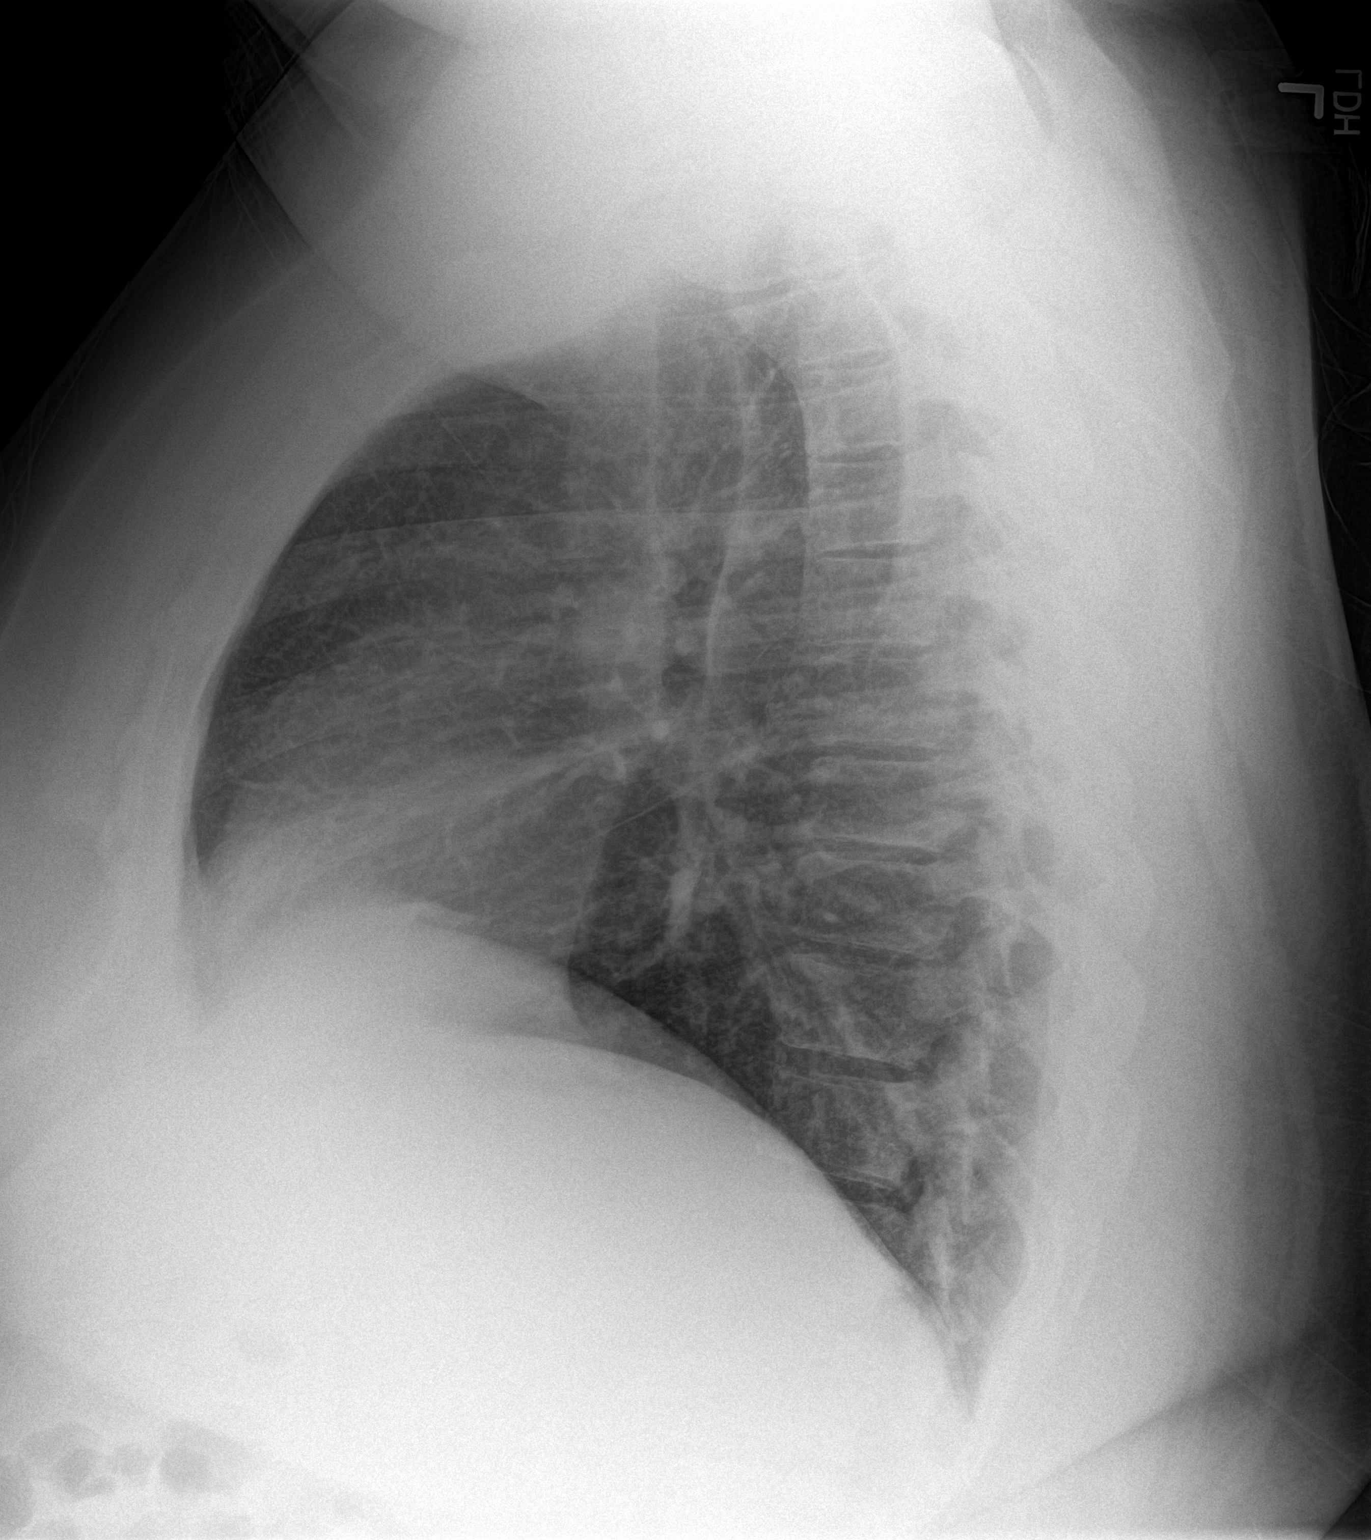

[2 of 2 positions shown; findings below may reference images not displayed]

FINDINGS: Mediastinum hilar structures normal. Lungs are clear. Heart size
normal. No pleural effusion or pneumothorax. No acute bony
abnormality . Acromioclavicular degenerative change view
IMPRESSION: No acute cardiopulmonary disease.
